# Patient Record
Sex: Female | Born: 1975 | Race: Black or African American
Health system: Midwestern US, Community
[De-identification: ages and names within clinical notes are randomized; demographics above are authoritative.]

## PROBLEM LIST (undated history)

## (undated) DIAGNOSIS — D259 Leiomyoma of uterus, unspecified: Secondary | ICD-10-CM

## (undated) HISTORY — PX: DILATION AND CURETTAGE OF UTERUS: SHX78

---

## 2001-08-07 NOTE — ED Provider Notes (Signed)
[facility]                      EMERGENCY DEPARTMENT TREATMENT REPORT   NAME:  Susan, Mack   MR #:         BILLING #: [id number]          DOS: 08/07/2001   TIME: 7:11 P   [id number]   cc:    Smitty Pluck, M.D.   Primary Physician:    Smitty Pluck, M.D.   CHIEF COMPLAINT:  Fever and sweats.   HISTORY OF PRESENT ILLNESS:  This 26 year old African-American female   presents at 33 complaining of intermittent painful breathing, cold   sweats, diffuse weakness, and muscle aches.  She has had this for the past   4 days, has coughed occasionally but has not brought anything up.  She   denies any shortness of breath, hemoptysis, calf symptoms, exertional chest   pain, rigors, or any other symptoms or injuries.   REVIEW OF SYSTEMS:   ENT: No sore throat, runny nose or other URI symptoms.   HEMATOLOGIC/LYMPHATIC:  No excessive bruising or lymph node swelling.   CARDIOVASCULAR:  No chest pain, chest pressure, or palpitations.   GASTROINTESTINAL:  No vomiting, diarrhea, or abdominal pain.   GENITOURINARY:  No dysuria, frequency, or urgency.  Normal menstrual period   last Sunday.   INTEGUMENTARY:  No rashes.   NEUROLOGICAL:  No headaches, sensory or motor symptoms.   All other systems negative.   PAST MEDICAL HISTORY, FAMILY HISTORY, SOCIAL HISTORY:  All noncontributory.   ALLERGIES:  Penicillin.   MEDICATIONS:  Birth control pills.   PHYSICAL EXAMINATION:   VITAL SIGNS:  Blood pressure 132/71, pulse 108, respirations 20,   temperature 101.9, O2 saturation 96% on room air.   GENERAL APPEARANCE:  The patient appears well developed and well nourished.   Appearance and behavior are age and situation appropriate.   HEENT:  Ears/Nose:  Hearing is grossly intact to voice.  Internal and   external examinations of the ears are unremarkable.   Mouth/Throat:   Surfaces of the pharynx, palate, and tongue are pink, moist, and without    lesions.    ENT:  Without stridor, sialorrhea, trismus, exudate, or   respiratory distress.   NECK:  Supple, nontender, symmetrical, no masses or JVD, trachea midline,   thyroid not enlarged, nodular, or tender.   RESPIRATORY:  Clear and equal breath sounds.  No respiratory distress,   tachypnea, or accessory muscle use.   CARDIOVASCULAR:  Heart regular, without murmurs, gallops, rubs, or thrills.   PMI not displaced.   Vascular:   Calves soft and nontender.  No peripheral edema or significant   varicosities. Carotid, femoral, and pedal pulses are satisfactory.  The   abdominal aorta is not palpably enlarged.   CHEST:  Chest symmetrical without masses or tenderness.   GI:  Abdomen soft, nontender, without complaint of pain to palpation.  No   hepatomegaly or splenomegaly.  No abdominal or inguinal masses appreciated   by inspection or palpation.  No cva tenderness.   SKIN:  Warm and dry without rashes.   PSYCHIATRIC:  Judgment appears appropriate.  Recent and remote memory   appear to be intact.  Oriented to time, place, and person.  Mood and affect   appropriate.   DIAGNOSTIC TESTING:   An x-ray was obtained because of the fever of unknown   origin, which showed a left lingular infiltrate, questionably  postobstructive, as read by myself.  Urine dips negative x 3.   COURSE IN THE EMERGENCY DEPARTMENT:  I spoke with Dr. Delane Ginger from   radiology, who will be in later to read the films. I am concerned that this   does represent a postobstructive infiltrate and have given the patient my   card and told her to call me this evening after 10 p.m.   I will also fax a   copy of this chart with the addended x-ray report to Dr. Sunny Schlein for her to   follow up on the patient.   The patient is discharged on Zithromax and Guaifenex, is given a   Proventil/Atrovent treatment to try and loosen the possible mucus plug   causing this pneumonia.   FINAL DIAGNOSIS:  Left lingular pneumonia, febrile.    DISPOSITION:  The patient is discharged home in stable condition, with   instructions to follow up with their regular doctor.  They are advised to   return immediately for any worsening or symptoms of concern.   ADDENDUM BY DR. MANOLIO:   Dr. Dellis Anes has read the patient's x-ray, agrees that there is a lingular   consolidation, does not see any signs that this is post obstructive.   However, he suggest a repeat chest film in several weeks; the patient is   referred to Dr. Sunny Schlein for same. The patient has been contacted with the   radiology reading and instructions.   Electronically Signed By:   Imogene Burn, M.D. 08/08/2001 01:36   ____________________________   Imogene Burn, M.D.   cd/jdm  D:  08/07/2001  T:  08/07/2001  7:10 P   [id number]

## 2011-12-14 NOTE — Care Plan (Signed)
Formatting of this note might be different from the original.  Problem: Breastfeeding (Adult, NICU, Newborn, Obstetrics, Pediatric)  Goal: Signs and symptoms of listed potential problems will be absent or manageable (reference (Breastfeeding (Adult, NICU, Newborn, Obstetrics, Pediatric)) CPG)  Outcome: Progressing  Lactation initial visit: Mom is a 36 year old P3, delivered vaginally on 11/9 at 0611.  Mom states she breastfeed her previous children without difficulties and states BFing going well with this baby.  Mom denies sore nipples or problems with baby latching on.  Reviewed breastfeeding basics:  supply and demand, newborn stomach size, early feeding cues,importance of skin to skin, positioning and baby led latch-on, asymetrical latch with signs of good, deep latch vs shallow, feeding frequency and duration, and log book for tracking infant feedings and output.   Guide to Successful Breastfeeding Booklet, Women's Health [employer] Resource Handout, and Breastfeeding Information Line number all given. Reviewed pt's medications and compatible birth control.  Discussed typical newborn weight loss and the importance of infant weight checks with Pediatrician 1-2 days post discharge.  Mom states she wants to rent a pump for when she is at home, to be able to give the baby some bottles if she is not home.  Mom denies going back to work right now, but states she still wants a pump.  She had called her insurance and said they require a doctor's prescription to get a breast pump.  Encouraged her to talk with her OB in the morning, and let her primary nurse know as well, so she can get the Rx before she leaves.  Discussed the difference between hospital grade rental pumps and store bought double electric/hand pumps discussed.  [employer] Resource handout given and list of area pump rental locations and lactation support services reviewed.  Mom states she is leaning more toward renting. Encouraged mom to let us know soon  if she wants to rent one from here.  Will recheck on Monday before discharge.  Mom denies any questions at this time.  Encouraged her to call [facility] or primary RN for assistance, questions or concerns.      Electronically signed by Asencion NobleWest, Lori E, RN at 12/14/2011  3:29 PM EST

## 2011-12-14 NOTE — H&P (Signed)
Formatting of this note is different from the original.  Images from the original note were not included.      [facility]  History and Physical    History of Present Illness:     Susan Mack is a 36 y.o. Female [id number] at 8427w5d with an estimated Date of Delivery: 11/29/09. EDC is based on early ultrasound.    Pt presents to L&D complaining of increasing contractions since 2000. She noted mild vaginal bleeding and fluid loss occuring about 0515 this AM. No s/sx of PIH, +FM    Pt is followed for her prenatal care by [facility] ([facility])    Other complications with this pregnancy include: AMA, A2DM (Metformin)    Subjective Data:     OBHx: A5W0981: G5P0131  Year     Delivery Type     GA     Sex    Weight        Complications  2005         SAB           6 Weeks  2005            SVD            1052w0d    M         3-7             Baby with CP  2007            SAB           6 Weeks    2010            MAB           6 Weeks                               D&C  2011            SVD            1827w5d    M         7-15    2013          Current    GynHx:  Menarche: 16  Menses: every 28 days, bleeding for 5 days  STDs: Denied  Abnormal paps: 2003 - now normal    PMHx:  Multiple miscarriages.     PSHx:  D&C 2010    SocHx:  No tobacco, alcohol, or illicits.     FamHx:  Family History   Problem Relation Age of Onset   ? Diabetes Mother    ? Cancer Maternal Grandmother      BREAST   ? Cancer Paternal Grandmother    ? Cancer Paternal Grandfather      LUNG   ? Hypertension Father    ? Cancer Maternal Grandfather      LUNG     Meds:  Metformin 500 BID.     Allergies:  Allergies   Allergen Reactions   ? Penicillins unknown     POSSIBLY RASH     Objective Data/Physical Exam:     Pulse 86  SpO2 100%  Breastfeeding? Yes    PHYSICAL EXAM:     Constitutional: She is a gravid alert, cooperative, healthy female   Cardiovascular: Regular rate, rhythm   Pulmonary: breath sounds normal and effort normal   Abdominal: gravid, appearance  normal, soft and non-tender   Extremities: no clubbing/cyanosis/edema  Pelvic/Vaginal  Exam     Cervix: Vaginal exam by: MD  Dilation: 5 cm  Effacement: 80%  Station: -3     Bedside Ultrasound            Single Deepest Pocket: 2.1 cm    Fetal Presentation: Vertex     Fetal Assessment:   Baseline: 150  Variability: Moderate (6-25 bpm)  Accelerations: Present  Decelerations:Absent       Contractions:  Toco:occurring every  3 minute(s)    Lab Review  Blood Type: O+  RPR: NR  Rubella: Immune  HBSAG: Negative  HIV: NR  GC: Negative  CHLAMYDIA: Negative  Group B Strep: Negative    No results found for this visit on 12/14/11 (from the past 24 hour(s)).    Assessment/Plan:     36 y.o. F Z5G3875 with IUP at [redacted]w[redacted]d, active labor. Complications include A2DM on metformin, AMA.     1. Pt will be staying for observation on L&D.  2. Anticipate vaginal delivery.   3. O+  4. Rubella Immune  5. GBS -  6. HIV/HBS/GC/Chlamydia negative.   7. Epidural and nubain planned for pain control.   8. q2h accuchecks     Jacky Kindle, M.D.  Emergency Medicine, PGY-1  12/14/2011, 5:09 AM  [phone]    OB ATTENDING  I have personally evaluated this patient with the resident and I agree with the assessment and plan as outlined above.  Arie Sabina, MD   Electronically signed by Elson Clan at 12/14/2011  6:46 AM EST

## 2011-12-14 NOTE — Procedures (Signed)
Formatting of this note might be different from the original.  Images from the original note were not included.                   [facility] Department of Obstetrics and Gynecology  Vaginal Delivery Note             Pre-operative Diagnosis: Intrauterine pregnancy at:4655w5d   Post-operative Diagnosis: same  Procedure: SVD  Surgeon: Magda KielN Gillman, MD    Assist:  Mertha FindersIbrahim A Hammad, MD,RES, MD    Anesthesia: Local      IInfant delivered in vertex presentation, OA position.No nuchal cord. Anterior shoulder delivered followed by the posterior shoulder without complication.  Cord was clamped and cut, infant handed to awaiting respiratory technician. Sample for cord gases taken. Placenta was delivered with gentle cord traction and massage, intact, and a 3 vessel cord was noted. A first degree perineal lacerations was noted and repaired with 2-0 vicryl.The lower uterine segment was cleared of clots and tone improved with massage. Uterus was firm and good hemostasis was observed upon completion of the procedure. Instrument, sponge, and needle counts were correct.     Infant A Delivery Info  Delivery Date/Time: Infant Delivery -Date Baby A: 12/14/11 @  Infant Delivery -Time Baby A: [phone]  Sex: Gender Baby A: Female  Apgars:  8/ 9 /    Findings:  Placenta complete, spontaneous, 3 VC    Estimated Blood Loss:  300    Specimens: none    Complications:  None; patient tolerated the procedure well.    Disposition: PACU - hemodynamically stable.    Condition: stable    The attending was present throughout the entire procedure    Author:   Alphia MohIbrahim Hammad, MD-PGY 2  Obstetrics & Gynecology  Pager # [phone]  12/14/2011, 6:34 AM    OB Attending Attestation: I was present and participated in  the entire procedure    Arie SabinaNicole C. Gillman, MD, FACOG    Electronically signed by Elson ClanGillman, Nicole at 12/14/2011  6:46 AM EST

## 2011-12-15 NOTE — Progress Notes (Signed)
Formatting of this note is different from the original.  Postpartum Progress Note - s/p Vaginal Delivery    S - no complaints, pain controlled, lochia decreasing, tolerating PO, urinating, ambulating    O -   BP 119/58  Pulse 75  Temp(Src) 97.5 F (36.4 C)  Resp 18  Ht 5\' 10"  (1.778 m)  Wt 136.079 kg (300 lb)  BMI 43.05 kg/m2  SpO2 99%  Breastfeeding? Yes  Systolic (24hrs), Avg:133 mmHg, Min:108 mmHg, Max:157 mmHg  Diastolic (24hrs), Avg:66 mmHg, Min:56 mmHg, Max:80 mmHg    General -  NAD, AO x 3  Heart -  RRR, no murmurs  Lung - CTAB  Abd -  S, NT, ND, Fundus firm   Ext -  No c/c/e    Lab Results   Component Value Date    HEMOGLOBIN 10.6* 12/15/2011    HEMOGLOBIN 11.8 12/14/2011    HCT 32.5* 12/15/2011    HCT 36.3 12/14/2011     A/P -   36 y.o. F Z3Y8657G6P0232 PPD#1 s/p SVD at 6868w5d, A2DM      1. Continue routine care  2. A2DM: pt previously on metformin, FBS today:89, will not restart home meds.   3. Blood Type: O positive, Rhogam not indicated  4. Rubella immune  5. Breast Feeding  6. Contraception - minipill  7. Discharge planned for PPD#2, 12/16/11    Laren BoomKatherine Weyer, MD, PGY1  Obstetrics and Gynecology  [facility]  Pager Number: [phone]  12/15/2011, 8:05 AM    OB Attending  I saw and evaluated patient personally.  I agree with resident assessment and plan.    Britt BoozerStephen S. Earlene Plateravis, M.D.  12/15/2011, 1:42 PM  Electronically signed by Kerin Ransomavis, Stephen S, MD at 12/15/2011  1:42 PM EST

## 2011-12-16 NOTE — Progress Notes (Signed)
Formatting of this note might be different from the original.  Lactation note:  I called DME and spoke to liaision Arlys JohnBrian to see if [employer] [place]. DME liaision to call & speak to patient. Dad had already called [employer] & asked about coverage.   Electronically signed by Carloyn MannerLee, Freya P, RN at 12/16/2011  4:53 PM EST

## 2011-12-16 NOTE — Progress Notes (Signed)
Formatting of this note is different from the original.  Postpartum Progress Note - s/p Vaginal Delivery    S - no complaints, no pain, lochia decreasing, tolerating PO, urinating, ambulating    O -   BP 131/78  Pulse 94  Temp(Src) 98.1 F (36.7 C)  Resp 20  Ht 5\' 10"  (1.778 m)  Wt 136.079 kg (300 lb)  BMI 43.05 kg/m2  SpO2 97%  Breastfeeding? Yes  Systolic (24hrs), Avg:130 mmHg, Min:128 mmHg, Max:132 mmHg  Diastolic (24hrs), Avg:69 mmHg, Min:59 mmHg, Max:78 mmHg    General -  NAD, AO x 3  Heart -  RRR, no murmurs  Lung - CTAB  Abd -  S, NT, ND, Fundus firm   Ext -  No c/c/e    Lab Results   Component Value Date    HEMOGLOBIN 10.6* 12/15/2011    HEMOGLOBIN 11.8 12/14/2011    HCT 32.5* 12/15/2011    HCT 36.3 12/14/2011     A/P -   36 y.o. F [id number] PPD2 s/p SVD at 7761w5d, A2DM.     1. Continue routine care  2. A2DM: pt previously on metformin, FBS yesterday:89, will not restart home meds.   3. Blood Type: O positive, Rhogam not indicated  4. Rubella immune  5. Breast Feeding  6. Contraception - minipill - counseled about importance of taking pill at same time everyday for effectiveness.   7. Discharge planned for today. Patient will f/u with [facility] (Dr. Jolyn Napattel in six weeks).     Jacky KindleZane W. Shuck, M.D.  Emergency Medicine, PGY-1  12/16/2011, 6:47 AM  [phone]    MFM Staff Note  Patient seen and evaluated.  Agree with assessment and plan.  Barbera SettersJames B. Loleta ChanceHill, MD  Electronically signed by Roxanna MewHill, James B at 12/16/2011  8:09 AM EST

## 2011-12-16 NOTE — Care Plan (Signed)
Formatting of this note might be different from the original.  Problem: Breastfeeding (Adult, NICU, Newborn, Obstetrics, Pediatric)  Goal: Signs and symptoms of listed potential problems will be absent or manageable (reference (Breastfeeding (Adult, NICU, Newborn, Obstetrics, Pediatric)) CPG)  Outcome: Progressing  Recheck Visit:  Mother is breastfeeding upon entry into room.  Baby observed with a shallow latch.  Discussed importance of a good, deep , asymmetrical latch to prevent nipple pain trauma and to provide a good transfer of colostrum/milk.   Demo'd breaking suction by inserting finger in infants mouth and assisted with a deeper latch.  Encouraged working with infant to stimulate a wide open mouth gape and bringing baby to breast, not breast to baby.  Emphasized importance of skin to skin, laid back positioning and baby led latching on.          Mother expresses interest in obtaining a pump and providing bottled breastmilk for baby.  Mother encouraged to contact insurance.  Physicians approached for prescription for breastpump.  Electronically signed by Benna DunksStarkey, Jacqueline D, RN at 12/16/2011 12:23 PM EST

## 2011-12-16 NOTE — Progress Notes (Signed)
Formatting of this note might be different from the original.  [facility]:      Thank you for this referral.  Carollee Siresnterviewed patient's spouse today and verified discharge address/telephone.  Following patient for home medical/respiratory needs (Breast Pump) upon discharge.  Will continue to follow and provide item(s) once patient/spouse confirms from her insurance company that [employer] [place] may provide item and patient agrees to accept item.  Spouse provided our contact telephone when decision if finalized.  Thanks again.    Genene ChurnJames M. [facility]  [facility] Liaison  [phone]  Electronically signed by Koleen Distanceculto, James M at 12/16/2011 12:20 PM EST

## 2011-12-16 NOTE — Discharge Summary (Signed)
Formatting of this note is different from the original.      [facility] Discharge Summary    Admission Date: 12/14/2011  Admission Diagnosis: Active labor    Discharge Date:  12/16/2011  Discharge Diagnosis: Spontaneous vaginal delivery at 1833w5d    Antepartum complications:   Patient Active Problem List    Diagnosis Date Noted   ? Supervision of normal pregnancy 12/14/2011   ? Pregnancy, subsequent 11/20/2009     Patient presented to L&D with increasing frequency of contractions with mild vaginal bleeding and fluid loss.   Followed by Dr. Jolyn Napattel  Pregnancy complicated by :A2DM (Metformin)    Intrapartum Course: Uncomplicated. First degree perineal laceration noted and repaired with 2-0 vicryl.     Postpartum Course:  Uncomplicated. FBS 89 PPD1, metformin stopped. No other issues noted.  All postpartum goals were achieved including pain control, PO toleration, voiding, lochia decreasing, and ambulating. Thus, she was discharged on PPD2 in stable condition.    Delivered By: Delivering Physician In: Caryl BisGilliman,MD     Infant A Delivery Info  Newborn Outcome Baby A: Liveborn  Date/Time of Delivery: Infant Delivery -Date Baby A: 12/14/11 @ Infant Delivery -Time Baby A: [phone]  Delivery Type: Type of Delivery Baby A: Vaginal  Sex: Gender Baby A: Female  Apgars:  Apgar Score 1 min Baby A: 8   Apgar Score 5 min Baby A: 9       Weight: Weight: Birth (grams) Baby A: 3745 Grams  Pounds: 8 lbs Ounce(s): 4   Information for the patient's newborn:  Effie BerkshireFutrell, Boy Emmanuel [[id number]][[id number]]   Weight: 3.745 kg (8 lb 4.1 oz)    Anesthesia: None    Intrapartum Complications: None    Laceration:  1st degree perineal laceration repaired with 2-0 vicryl    Placenta: 3VC, complete, delivered spontaneously.     Rubella vaccine:  immune  Immunization History   Administered Date(s) Administered   ? Influenza Vaccine Whole 11/05/2011   ? PNEUMO 0.965mL(PNEUMOVAX 23)VAC Inj 12/15/2011   ? TDAP 2.5-8-5 Lf-mcg-lf/0.5 Ml(BOOSTRIX-syr)  11/22/2009     No results found for this basename: RUBELLA,  in the last 72 hours    Postpartum Info:   36 y.o. P2R5188G5P2133 who was discharged on 12/16/2011 s/p SVD@[redacted]w[redacted]d . .      Lab Results   Component Value Date    HEMOGLOBIN 10.6* 12/15/2011    HCT 32.5* 12/15/2011     ABORH TYPE   Date Value Range Status   05/02/2009 O Pos   Final       Plan:     Current Discharge Medication List     START taking these medications    Details   Norethindrone, Contraceptive, (NOR-QD) 0.35 mg PO TABS Take 1 Tab by Mouth Once a Day.  Qty: 30 Tab, Refills: 11     calcium carbonate/vitamin D (OSCAL-D) 600 mg(1,500mg ) -200 unit PO TABS Take 1 Tab by Mouth Twice Daily.  Qty: 60 Tab, Refills: 5     ibuprofen (MOTRIN) 600 mg PO TABS Take 1 Tab by Mouth Every 6 Hours As Needed (mild pain).  Qty: 30 Tab, Refills: 0     Docusate Sodium (COLACE) 100 mg PO TABS Take 1 Tab by Mouth Once a Day.  Qty: 30 Tab, Refills: 0       STOP taking these medications      iron-fa-b cmplx-vit c-dss (NEPHRON FA) PO TABS Comments:   Reason for Stopping:       metFORMIN (GLUCOPHAGE) 500  mg PO TABS Comments:   Reason for Stopping:       PRENATAL VIT/FE FUMARATE/FA (PRENATAL PLUS) 27-1 mg PO TABS Comments:   Reason for Stopping:          Contraceptives: oral birth control pills (minipill)  Preprinted discharge instructions given to patient.    Discharge Procedure Orders  DISCHARGE: Regular Diet   DISCHARGE: Regular Diet     DISCHARGE ACTIVITY   Pelvic rest for 6 weeks.   No driving for 2 weeks or while on narcotics.  No heavy lifting or strenuous exercise for 6 weeks.     Discharge: Notify MD for signs and symptoms-   Discharge: Notify MD for signs and symptoms-  Severe pain, heavy bleeding, temperature GREATER than 100.36F or any other concerns     DISCHARGE: Follow Up with Obstetrician for post partum visit   Follow up with Dr. Jolyn Nap in 6 week(s)  [phone]       Jacky Kindle, M.D.  Emergency Medicine, PGY-1  12/16/2011, 7:15 AM  [phone]      Electronically  signed by Roxanna Mew B at 12/16/2011  8:10 AM EST

## 2014-02-20 ENCOUNTER — Emergency Department: Payer: Self-pay | Admitting: Physician Assistant

## 2014-02-20 LAB — CBC
HCT: 35.8 % (ref 35.0–47.0)
HGB: 11.5 g/dL — ABNORMAL LOW (ref 12.0–16.0)
MCH: 27.8 pg (ref 26.0–34.0)
MCHC: 32.1 g/dL (ref 32.0–36.0)
MCV: 87 fL (ref 80–100)
PLATELETS: 296 10*3/uL (ref 150–440)
RBC: 4.13 10*6/uL (ref 3.80–5.20)
RDW: 12.9 % (ref 11.5–14.5)
WBC: 8.3 10*3/uL (ref 3.6–11.0)

## 2014-02-20 LAB — BASIC METABOLIC PANEL
ANION GAP: 5 — AB (ref 7–16)
BUN: 10 mg/dL (ref 7–18)
Calcium, Total: 8.4 mg/dL — ABNORMAL LOW (ref 8.5–10.1)
Chloride: 104 mmol/L (ref 98–107)
Co2: 30 mmol/L (ref 21–32)
Creatinine: 0.9 mg/dL (ref 0.60–1.30)
Glucose: 129 mg/dL — ABNORMAL HIGH (ref 65–99)
OSMOLALITY: 278 (ref 275–301)
Potassium: 3.9 mmol/L (ref 3.5–5.1)
Sodium: 139 mmol/L (ref 136–145)

## 2015-07-21 ENCOUNTER — Other Ambulatory Visit: Payer: Self-pay | Admitting: Adult Health

## 2015-07-21 DIAGNOSIS — M7989 Other specified soft tissue disorders: Secondary | ICD-10-CM

## 2015-07-26 ENCOUNTER — Ambulatory Visit: Payer: Managed Care, Other (non HMO)

## 2015-08-01 ENCOUNTER — Other Ambulatory Visit: Payer: Self-pay | Admitting: Adult Health

## 2015-08-01 DIAGNOSIS — M7989 Other specified soft tissue disorders: Secondary | ICD-10-CM

## 2015-08-03 ENCOUNTER — Ambulatory Visit
Admission: RE | Admit: 2015-08-03 | Discharge: 2015-08-03 | Disposition: A | Discharge status: Home / Self Care | Payer: Managed Care, Other (non HMO) | Source: Ambulatory Visit | Attending: Adult Health | Admitting: Adult Health

## 2015-08-03 DIAGNOSIS — M7989 Other specified soft tissue disorders: Secondary | ICD-10-CM | POA: Diagnosis present

## 2016-03-11 ENCOUNTER — Ambulatory Visit (INDEPENDENT_AMBULATORY_CARE_PROVIDER_SITE_OTHER): Payer: Commercial Managed Care - PPO | Admitting: Vascular Surgery

## 2016-03-11 ENCOUNTER — Encounter (INDEPENDENT_AMBULATORY_CARE_PROVIDER_SITE_OTHER): Payer: Self-pay | Admitting: Vascular Surgery

## 2016-03-11 DIAGNOSIS — M79605 Pain in left leg: Secondary | ICD-10-CM | POA: Diagnosis not present

## 2016-03-11 DIAGNOSIS — I890 Lymphedema, not elsewhere classified: Secondary | ICD-10-CM

## 2016-03-11 DIAGNOSIS — I872 Venous insufficiency (chronic) (peripheral): Secondary | ICD-10-CM | POA: Diagnosis not present

## 2016-03-11 DIAGNOSIS — M79604 Pain in right leg: Secondary | ICD-10-CM | POA: Diagnosis not present

## 2016-03-11 DIAGNOSIS — M79609 Pain in unspecified limb: Secondary | ICD-10-CM | POA: Insufficient documentation

## 2016-03-11 NOTE — Progress Notes (Signed)
MRN : [id number]  Suzanne Jenkins is a 41 y.o. ([birth year]) female who presents with chief complaint of  Chief Complaint  Patient presents with  . Follow-up  .  History of Present Illness: The patient returns to the office for followup evaluation regarding leg swelling.  The swelling has persisted and the pain associated with swelling continues. There have not been any interval development of a ulcerations or wounds.  Since the previous visit the patient has been wearing graduated compression stockings and has noted little if any improvement in the lymphedema. The patient has been using compression routinely morning until night.  The patient also states elevation during the day and exercise is being done too.   No outpatient prescriptions have been marked as taking for the 03/11/16 encounter (Office Visit) with Renford Dills, MD.    History reviewed. No pertinent past medical history.  Past Surgical History:  Procedure Laterality Date  . DILATION AND CURETTAGE OF UTERUS      Social History Social History  Substance Use Topics  . Smoking status: Never Smoker  . Smokeless tobacco: Never Used  . Alcohol use Yes     Comment: ocassionally    Family History Family History  Problem Relation Age of Onset  . Diabetes Mother   . Hypertension Father   . Cancer Paternal Grandfather   No family history of bleeding/clotting disorders, porphyria or autoimmune disease   Allergies  Allergen Reactions  . Penicillins Other (See Comments)     REVIEW OF SYSTEMS (Negative unless checked)  Constitutional: [] Weight loss  [] Fever  [] Chills Cardiac: [] Chest pain   [] Chest pressure   [] Palpitations   [] Shortness of breath when laying flat   [] Shortness of breath with exertion. Vascular:  [] Pain in legs with walking   [] Pain in legs at rest  [] History of DVT   [] Phlebitis   [x] Swelling in legs   [x] Varicose veins   [] Non-healing ulcers Pulmonary:   [] Uses home oxygen   [] Productive  cough   [] Hemoptysis   [] Wheeze  [] COPD   [] Asthma Neurologic:  [] Dizziness   [] Seizures   [] History of stroke   [] History of TIA  [] Aphasia   [] Vissual changes   [] Weakness or numbness in arm   [] Weakness or numbness in leg Musculoskeletal:   [] Joint swelling   [] Joint pain   [] Low back pain Hematologic:  [] Easy bruising  [] Easy bleeding   [] Hypercoagulable state   [] Anemic Gastrointestinal:  [] Diarrhea   [] Vomiting  [] Gastroesophageal reflux/heartburn   [] Difficulty swallowing. Genitourinary:  [] Chronic kidney disease   [] Difficult urination  [] Frequent urination   [] Blood in urine Skin:  [x] Venous Rashes   [] Ulcers  Psychological:  [] History of anxiety   []  History of major depression.  Physical Examination  Vitals:   03/11/16 0928  BP: (!) 149/91  Pulse: 86  Resp: 18  Weight: (!) 362 lb (164.2 kg)  Height: 5\' 10"  (1.778 m)   Body mass index is 51.94 kg/m. Gen: WD/WN, NAD Head: [place]/AT, No temporalis wasting.  Ear/Nose/Throat: Hearing grossly intact, nares w/o erythema or drainage, poor dentition Eyes: PER, EOMI, sclera nonicteric.  Neck: Supple, no masses.  No bruit or JVD.  Pulmonary:  Good air movement, clear to auscultation bilaterally, no use of accessory muscles.  Cardiac: RRR, normal S1, S2, no Murmurs. Vascular:   4+ pitting edema of the ankles and legs bilaterally venous stasis dermatitis noted bilaterally no ulcers Vessel Right Left  Radial Palpable Palpable  Ulnar Palpable Palpable  Brachial Palpable Palpable  Carotid Palpable Palpable  Femoral Palpable Palpable  Popliteal Palpable Palpable  PT Trace Palpable Trace Palpable  DP Trace Palpable Trace Palpable  Gastrointestinal: soft, non-distended. No guarding/no peritoneal signs.  Musculoskeletal: M/S 5/5 throughout.  No deformity or atrophy.  Neurologic: CN 2-12 intact. Pain and light touch intact in extremities.  Symmetrical.  Speech is fluent. Motor exam as listed above. Psychiatric: Judgment intact, Mood &  affect appropriate for pt's clinical situation. Dermatologic: No rashes or ulcers noted.  No changes consistent with cellulitis. Lymph : No Cervical lymphadenopathy, no lichenification or skin changes of chronic lymphedema.  CBC Lab Results  Component Value Date   WBC 8.3 02/20/2014   HGB 11.5 (L) 02/20/2014   HCT 35.8 02/20/2014   MCV 87 02/20/2014   PLT 296 02/20/2014    BMET    Component Value Date/Time   NA 139 02/20/2014 1514   K 3.9 02/20/2014 1514   CL 104 02/20/2014 1514   CO2 30 02/20/2014 1514   GLUCOSE 129 (H) 02/20/2014 1514   BUN 10 02/20/2014 1514   CREATININE 0.90 02/20/2014 1514   CALCIUM 8.4 (L) 02/20/2014 1514   GFRNONAA >60 02/20/2014 1514   GFRAA >60 02/20/2014 1514   CrCl cannot be calculated (Patient's most recent lab result is older than the maximum 21 days allowed.).  COAG No results found for: INR, PROTIME  Radiology No results found.  Assessment/Plan 1. Lymph edema Recommend:  No surgery or intervention at this point in time.    I have reviewed my previous discussion with the patient regarding swelling and why it causes symptoms.  Patient will continue wearing graduated compression stockings class 1 (20-30 mmHg) on a daily basis. The patient will  beginning wearing the stockings first thing in the morning and removing them in the evening. The patient is instructed specifically not to sleep in the stockings.    In addition, behavioral modification including several periods of elevation of the lower extremities during the day will be continued.  This was reviewed with the patient during the initial visit.  The patient will also continue routine exercise, especially walking on a daily basis as was discussed during the initial visit.    Despite conservative treatments including graduated compression therapy class 1 and behavioral modification including exercise and elevation the patient  has not obtained adequate control of the lymphedema.  The  patient still has stage 3 lymphedema and therefore, I believe that a lymph pump should be added to improve the control of the patient's lymphedema.  Additionally, a lymph pump is warranted because it will reduce the risk of cellulitis and ulceration in the future.  Patient should follow-up in six months    2. Chronic venous insufficiency See #1  3. Pain in both lower extremities See #1    Levora DredgeGregory Schnier, MD  03/11/2016 10:05 AM

## 2016-07-12 ENCOUNTER — Ambulatory Visit: Payer: Commercial Managed Care - PPO | Attending: Internal Medicine

## 2016-07-12 DIAGNOSIS — G4733 Obstructive sleep apnea (adult) (pediatric): Secondary | ICD-10-CM | POA: Diagnosis present

## 2016-09-09 ENCOUNTER — Ambulatory Visit (INDEPENDENT_AMBULATORY_CARE_PROVIDER_SITE_OTHER): Payer: Commercial Managed Care - PPO | Admitting: Vascular Surgery

## 2017-01-10 ENCOUNTER — Other Ambulatory Visit: Payer: Self-pay | Admitting: Family Medicine

## 2017-01-10 DIAGNOSIS — R109 Unspecified abdominal pain: Secondary | ICD-10-CM

## 2017-01-16 ENCOUNTER — Ambulatory Visit
Admission: RE | Admit: 2017-01-16 | Discharge: 2017-01-16 | Disposition: A | Discharge status: Home / Self Care | Payer: Commercial Managed Care - PPO | Source: Ambulatory Visit | Attending: Family Medicine | Admitting: Family Medicine

## 2017-01-16 DIAGNOSIS — K760 Fatty (change of) liver, not elsewhere classified: Secondary | ICD-10-CM | POA: Diagnosis not present

## 2017-01-16 DIAGNOSIS — R109 Unspecified abdominal pain: Secondary | ICD-10-CM | POA: Diagnosis not present

## 2017-11-11 ENCOUNTER — Telehealth (INDEPENDENT_AMBULATORY_CARE_PROVIDER_SITE_OTHER): Payer: Self-pay

## 2017-11-11 NOTE — Telephone Encounter (Signed)
Patient was last seen in Feb/2018 and called asking about the lymphedema pump which had been order back in 2018.The patient stated that she had not heard from the company but I inform her that she needed to be seen in the office because her last office visit was in 2018.The patient will be making a upcoming appointment

## 2018-01-21 ENCOUNTER — Other Ambulatory Visit: Payer: Self-pay | Admitting: Student

## 2018-01-21 DIAGNOSIS — Z1231 Encounter for screening mammogram for malignant neoplasm of breast: Secondary | ICD-10-CM

## 2018-01-22 ENCOUNTER — Encounter (INDEPENDENT_AMBULATORY_CARE_PROVIDER_SITE_OTHER): Payer: Self-pay | Admitting: Vascular Surgery

## 2018-01-22 ENCOUNTER — Ambulatory Visit (INDEPENDENT_AMBULATORY_CARE_PROVIDER_SITE_OTHER): Payer: Commercial Managed Care - PPO | Admitting: Vascular Surgery

## 2018-01-22 VITALS — BP 154/92 | HR 104 | Resp 16 | Ht 70.5 in | Wt 353.0 lb

## 2018-01-22 DIAGNOSIS — M79604 Pain in right leg: Secondary | ICD-10-CM | POA: Diagnosis not present

## 2018-01-22 DIAGNOSIS — I872 Venous insufficiency (chronic) (peripheral): Secondary | ICD-10-CM | POA: Diagnosis not present

## 2018-01-22 DIAGNOSIS — M79605 Pain in left leg: Secondary | ICD-10-CM | POA: Diagnosis not present

## 2018-01-22 DIAGNOSIS — I890 Lymphedema, not elsewhere classified: Secondary | ICD-10-CM

## 2018-01-22 NOTE — Progress Notes (Signed)
MRN : [id number]  Suzanne Jenkins is a 42 y.o. ([birth year]) female who presents with chief complaint of  Chief Complaint  Patient presents with  . Follow-up  .  History of Present Illness:  The patient returns to the office for followup evaluation regarding leg swelling.  The swelling has persisted and the pain associated with swelling continues. There have not been any interval development of a ulcerations or wounds.  Since the previous visit the patient has been wearing graduated compression stockings and has noted little if any improvement in the lymphedema. The patient has been using compression routinely morning until night.  The patient also states elevation during the day and exercise is being done too.   Current Meds  Medication Sig  . amphetamine-dextroamphetamine (ADDERALL XR) 25 MG 24 hr capsule Take 1 capsule by mouth daily.  . Cholecalciferol (VITAMIN D3) 50 MCG (2000 UT) capsule Take 1 capsule by mouth daily.  . Multiple Vitamins-Minerals (MULTIVITAMIN ADULT PO) Take 1 tablet by mouth daily.  . Vitamin D, Ergocalciferol, (DRISDOL) 1.25 MG (50000 UT) CAPS capsule Take 1 capsule by mouth once a week.    History reviewed. No pertinent past medical history.  Past Surgical History:  Procedure Laterality Date  . DILATION AND CURETTAGE OF UTERUS      Social History Social History   Tobacco Use  . Smoking status: Never Smoker  . Smokeless tobacco: Never Used  Substance Use Topics  . Alcohol use: Yes    Comment: ocassionally  . Drug use: No    Family History Family History  Problem Relation Age of Onset  . Diabetes Mother   . Hypertension Father   . Cancer Paternal Grandfather     Allergies  Allergen Reactions  . Penicillins Other (See Comments)     REVIEW OF SYSTEMS (Negative unless checked)  Constitutional: [] Weight loss  [] Fever  [] Chills Cardiac: [] Chest pain   [] Chest pressure   [] Palpitations   [] Shortness of breath when laying flat    [] Shortness of breath with exertion. Vascular:  [] Pain in legs with walking   [x] Pain in legs with dependency  [] History of DVT   [] Phlebitis   [x] Swelling in legs   [] Varicose veins   [] Non-healing ulcers Pulmonary:   [] Uses home oxygen   [] Productive cough   [] Hemoptysis   [] Wheeze  [] COPD   [] Asthma Neurologic:  [] Dizziness   [] Seizures   [] History of stroke   [] History of TIA  [] Aphasia   [] Vissual changes   [] Weakness or numbness in arm   [] Weakness or numbness in leg Musculoskeletal:   [] Joint swelling   [x] Joint pain   [x] Low back pain Hematologic:  [] Easy bruising  [] Easy bleeding   [] Hypercoagulable state   [] Anemic Gastrointestinal:  [] Diarrhea   [] Vomiting  [] Gastroesophageal reflux/heartburn   [] Difficulty swallowing. Genitourinary:  [] Chronic kidney disease   [] Difficult urination  [] Frequent urination   [] Blood in urine Skin:  [] Rashes   [] Ulcers  Psychological:  [] History of anxiety   []  History of major depression.  Physical Examination  Vitals:   01/22/18 1604  BP: (!) 154/92  Pulse: (!) 104  Resp: 16  Weight: (!) 353 lb (160.1 kg)  Height: 5' 10.5" (1.791 m)   Body mass index is 49.93 kg/m. Gen: WD/WN, NAD Head: [place]/AT, No temporalis wasting.  Ear/Nose/Throat: Hearing grossly intact, nares w/o erythema or drainage Eyes: PER, EOMI, sclera nonicteric.  Neck: Supple, no large masses.   Pulmonary:  Good air movement, no audible wheezing bilaterally, no  use of accessory muscles.  Cardiac: RRR, no JVD Vascular: scattered varicosities present bilaterally.  Mild venous stasis changes to the legs bilaterally.  3-4+ soft pitting edema left > right Vessel Right Left  Radial Palpable Palpable  PT Palpable Palpable  DP Palpable Palpable  Gastrointestinal: Non-distended. No guarding/no peritoneal signs.  Musculoskeletal: M/S 5/5 throughout.  No deformity or atrophy.  Neurologic: CN 2-12 intact. Symmetrical.  Speech is fluent. Motor exam as listed above. Psychiatric: Judgment  intact, Mood & affect appropriate for pt's clinical situation. Dermatologic: venous rashes no ulcers noted.  No changes consistent with cellulitis. Lymph : No lichenification or skin changes of chronic lymphedema.  CBC Lab Results  Component Value Date   WBC 8.3 02/20/2014   HGB 11.5 (L) 02/20/2014   HCT 35.8 02/20/2014   MCV 87 02/20/2014   PLT 296 02/20/2014    BMET    Component Value Date/Time   NA 139 02/20/2014 1514   K 3.9 02/20/2014 1514   CL 104 02/20/2014 1514   CO2 30 02/20/2014 1514   GLUCOSE 129 (H) 02/20/2014 1514   BUN 10 02/20/2014 1514   CREATININE 0.90 02/20/2014 1514   CALCIUM 8.4 (L) 02/20/2014 1514   GFRNONAA >60 02/20/2014 1514   GFRAA >60 02/20/2014 1514   CrCl cannot be calculated (Patient's most recent lab result is older than the maximum 21 days allowed.).  COAG No results found for: INR, PROTIME  Radiology No results found.    Assessment/Plan 1. Lymphedema Recommend:  No surgery or intervention at this point in time.    I have reviewed my previous discussion with the patient regarding swelling and why it causes symptoms.  Patient will continue wearing graduated compression stockings class 1 (20-30 mmHg) on a daily basis. The patient will  beginning wearing the stockings first thing in the morning and removing them in the evening. The patient is instructed specifically not to sleep in the stockings.    In addition, behavioral modification including several periods of elevation of the lower extremities during the day will be continued.  This was reviewed with the patient during the initial visit.  The patient will also continue routine exercise, especially walking on a daily basis as was discussed during the initial visit.    Despite conservative treatments including graduated compression therapy class 1 and behavioral modification including exercise and elevation the patient  has not obtained adequate control of the lymphedema.  The patient  still has stage 3 lymphedema and therefore, I believe that a lymph pump should be added to improve the control of the patient's lymphedema.  Additionally, a lymph pump is warranted because it will reduce the risk of cellulitis and ulceration in the future.  Patient should follow-up in six months    2. Chronic venous insufficiency Recommend:  No surgery or intervention at this point in time.    I have reviewed my previous discussion with the patient regarding swelling and why it causes symptoms.  Patient will continue wearing graduated compression stockings class 1 (20-30 mmHg) on a daily basis. The patient will  beginning wearing the stockings first thing in the morning and removing them in the evening. The patient is instructed specifically not to sleep in the stockings.    In addition, behavioral modification including several periods of elevation of the lower extremities during the day will be continued.  This was reviewed with the patient during the initial visit.  The patient will also continue routine exercise, especially walking on a daily  basis as was discussed during the initial visit.    Despite conservative treatments including graduated compression therapy class 1 and behavioral modification including exercise and elevation the patient  has not obtained adequate control of the lymphedema.  The patient still has stage 3 lymphedema and therefore, I believe that a lymph pump should be added to improve the control of the patient's lymphedema.  Additionally, a lymph pump is warranted because it will reduce the risk of cellulitis and ulceration in the future.  Patient should follow-up in six months    3. Pain in both lower extremities See #1&2    Levora Dredge, MD  01/22/2018 4:47 PM

## 2018-02-12 DIAGNOSIS — E538 Deficiency of other specified B group vitamins: Secondary | ICD-10-CM | POA: Diagnosis not present

## 2018-02-12 DIAGNOSIS — E119 Type 2 diabetes mellitus without complications: Secondary | ICD-10-CM | POA: Diagnosis not present

## 2018-02-12 DIAGNOSIS — E559 Vitamin D deficiency, unspecified: Secondary | ICD-10-CM | POA: Diagnosis not present

## 2018-02-17 ENCOUNTER — Ambulatory Visit
Admission: RE | Admit: 2018-02-17 | Discharge: 2018-02-17 | Disposition: A | Discharge status: Home / Self Care | Payer: Commercial Managed Care - PPO | Source: Ambulatory Visit | Attending: Student | Admitting: Student

## 2018-02-17 ENCOUNTER — Encounter (HOSPITAL_COMMUNITY): Payer: Self-pay

## 2018-02-17 DIAGNOSIS — Z1231 Encounter for screening mammogram for malignant neoplasm of breast: Secondary | ICD-10-CM | POA: Insufficient documentation

## 2018-02-23 DIAGNOSIS — R6889 Other general symptoms and signs: Secondary | ICD-10-CM | POA: Diagnosis not present

## 2018-02-23 DIAGNOSIS — J101 Influenza due to other identified influenza virus with other respiratory manifestations: Secondary | ICD-10-CM | POA: Diagnosis not present

## 2018-07-27 ENCOUNTER — Ambulatory Visit (INDEPENDENT_AMBULATORY_CARE_PROVIDER_SITE_OTHER): Payer: Self-pay | Admitting: Vascular Surgery

## 2019-06-24 IMAGING — MG DIGITAL SCREENING BILATERAL MAMMOGRAM WITH TOMO AND CAD
6 of 12 series · 6 of 36 positions shown · non-contrast
Comparison: Previous exam(s).

CLINICAL DATA: Screening.

EXAM:
DIGITAL SCREENING BILATERAL MAMMOGRAM WITH TOMO AND CAD

[L CC synth-2D (1 of 2)]
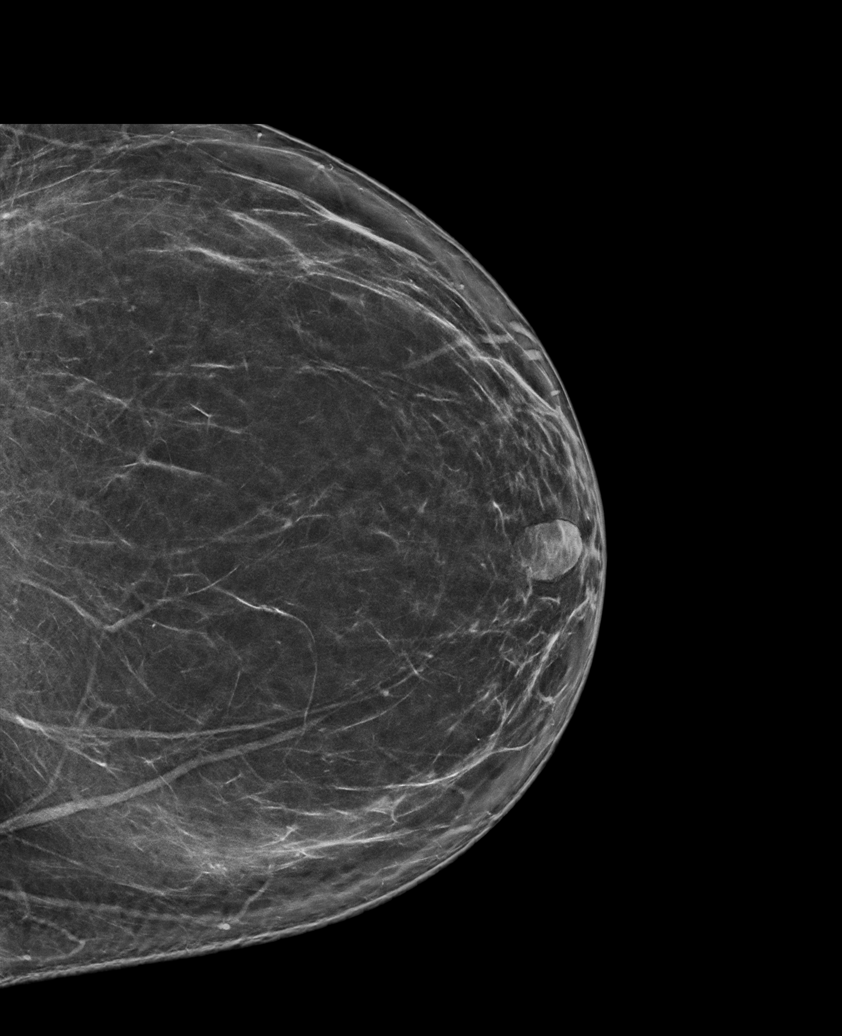

[R CC synth-2D]
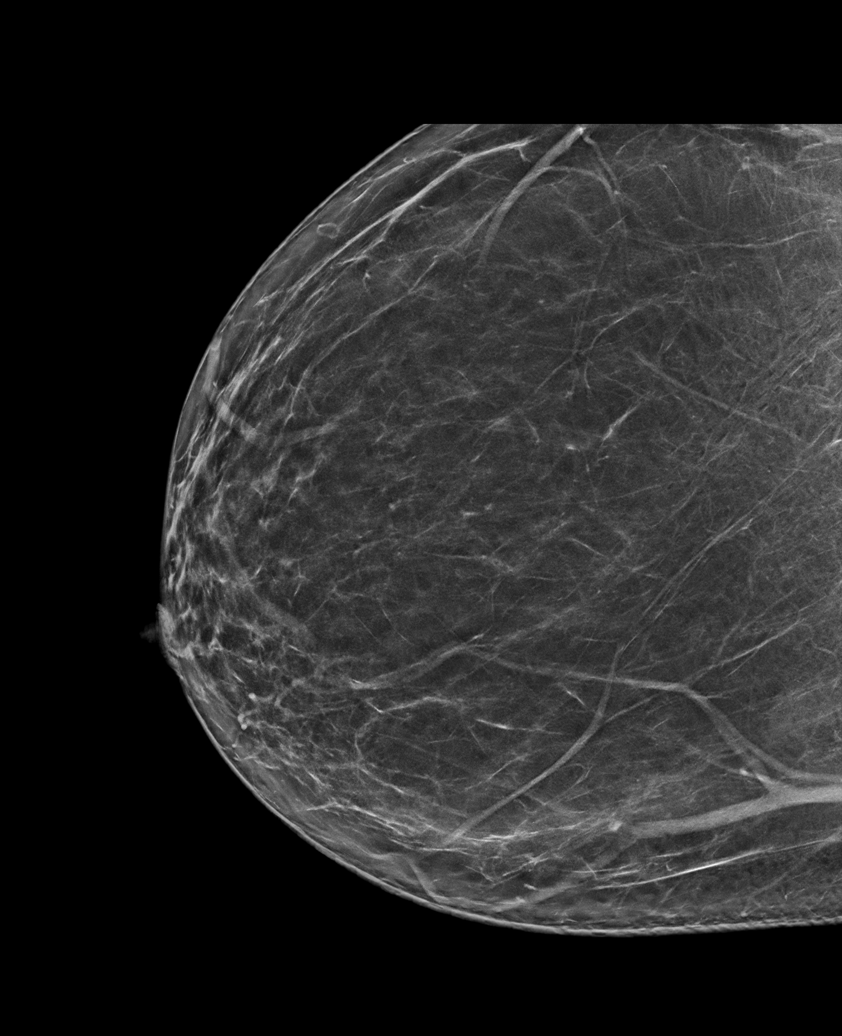

[L CC synth-2D (2 of 2)]
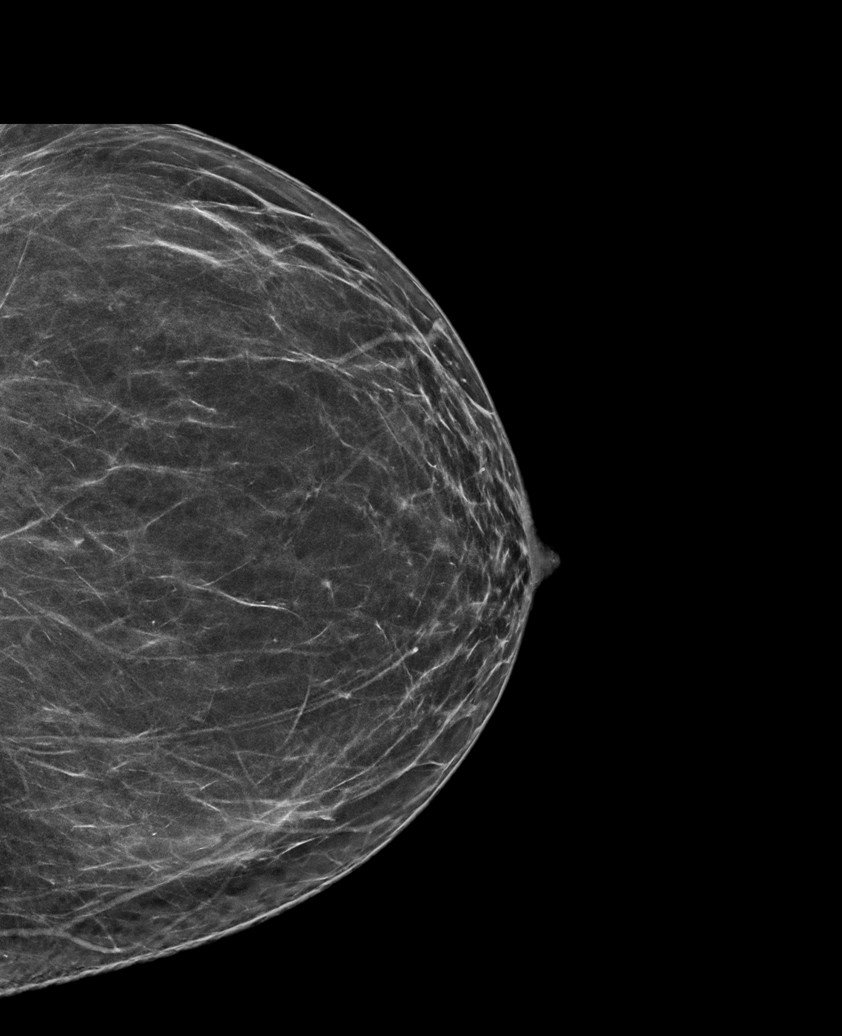

[R XCCL synth-2D]
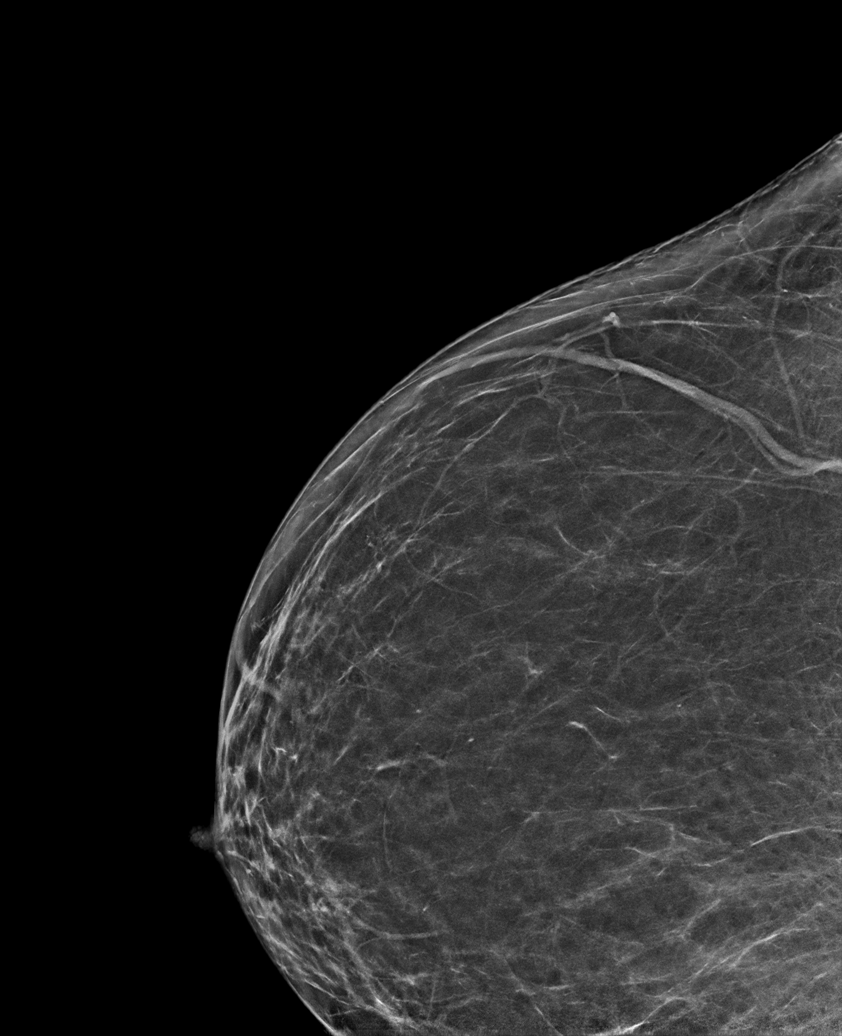

[R MLO synth-2D]
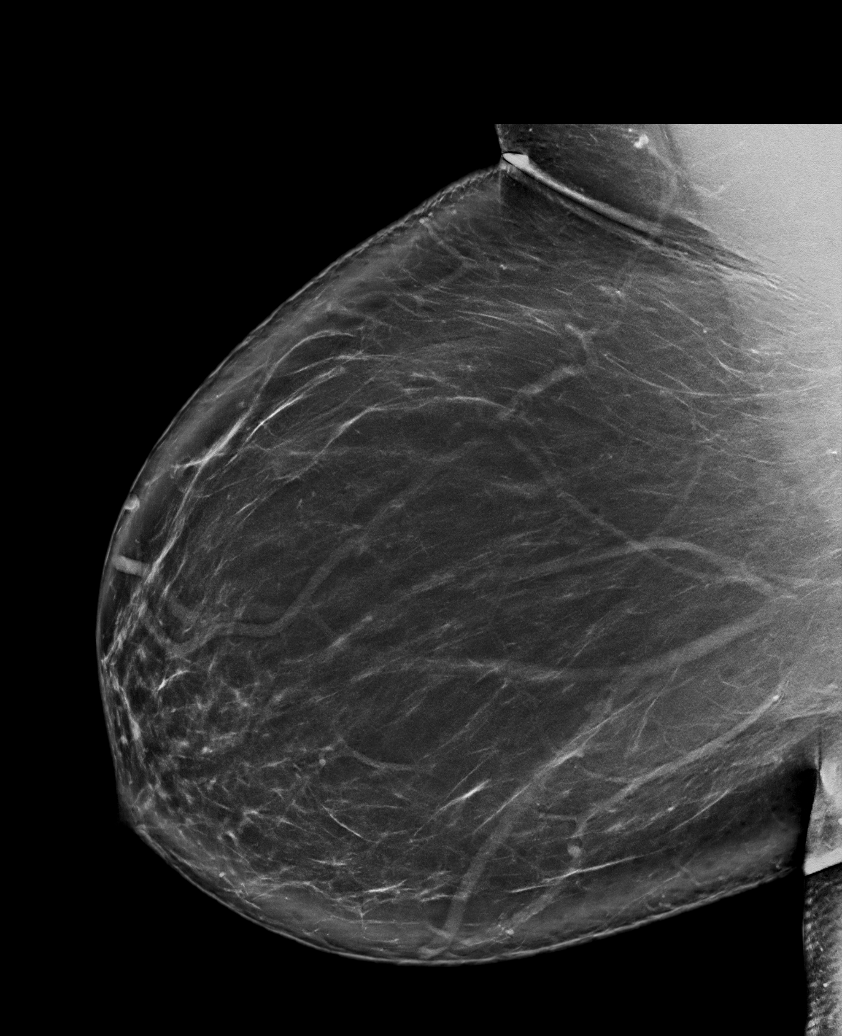

[L MLO synth-2D]
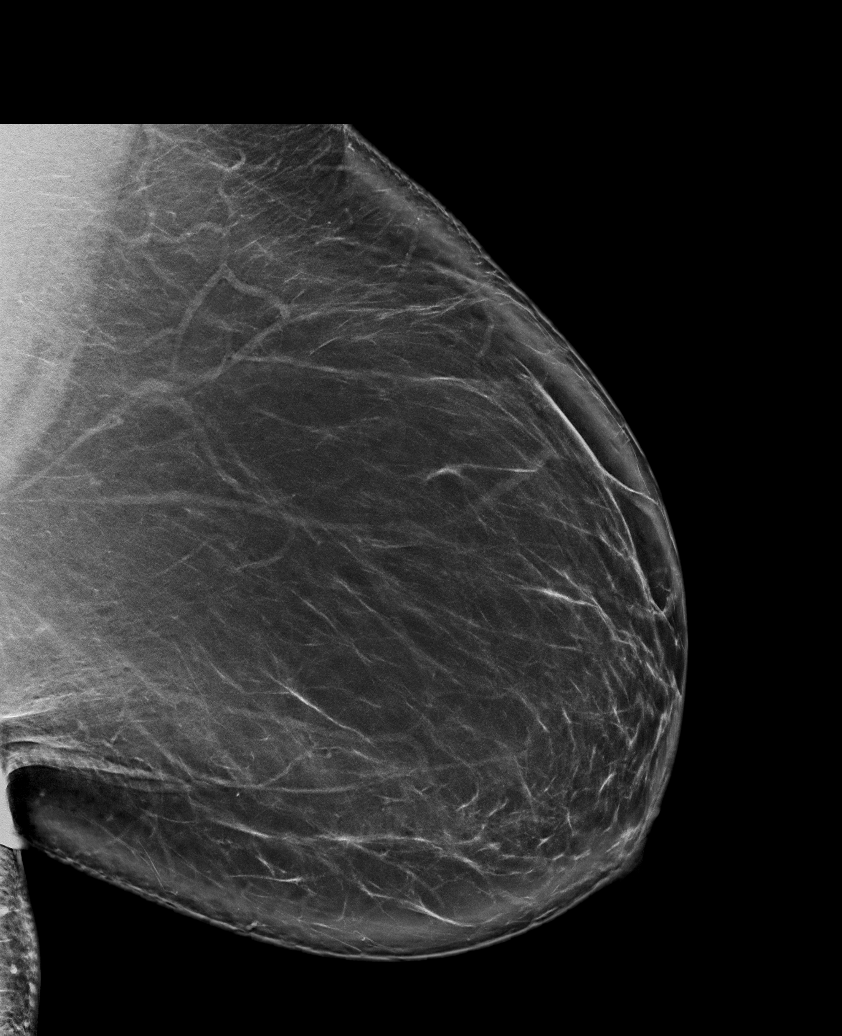

[6 of 36 positions shown; findings below may reference images not displayed]

ACR Breast Density Category b: There are scattered areas of
fibroglandular density.
FINDINGS: There are no findings suspicious for malignancy. Images were
processed with CAD.
IMPRESSION: No mammographic evidence of malignancy. A result letter of this
screening mammogram will be mailed directly to the patient.

RECOMMENDATION:
Screening mammogram in one year. (Code:[id number])

BI-RADS CATEGORY  1: Negative.

## 2021-09-20 NOTE — Progress Notes (Signed)
Formatting of this note is different from the original.  Patient here for yearly contact lens check.    Patient has been doing great in these contacts.     Patient has had no issues    Patient to continue with the current routine of wear and disinfection/BIOTRUE    HISTORY OF ALLERGY TO OPTIFREE    Patient to discard contacts as instructed every 2 weeks     Patient instructed that if any issues occur in the contacts they are to stop the contacts and call the office    ISSUED WRITTEN CLRX    Final Contact Lens Rx     Brand Base Curve Diameter Sphere Cylinder Axis Addl. Specs   Right Acuvue Oasys [place] 8.6 14.5 -8.50 -1.75 010 6pk   Left Acuvue Oasys [place] 8.6 14.5 -9.00 -1.25 170 6pk   Expiration Date: 09/21/2022        $60 today       Patient to return in 1 year for contact lens check then CE with Dr. Tamera Reason       Electronically signed by Dietrich Pates Pritt, COT at 09/20/2021 10:30 AM EDT

## 2022-03-13 ENCOUNTER — Inpatient Hospital Stay: Primary: Family

## 2022-03-13 NOTE — Other (Signed)
PAT Activity Status Questionnaire       Yes No Points for YES    Are you able to climb a flight of stairs or walk up a hill? [x]  []  1   Are you able to do heavy work around the house like lifting or moving heavy furniture? [x]  []  1   Do yardwork like raking leaves, weeding or pushing a power mower? [x]  []  1   Participate in strenuous sports like swimming, singles tennis, football, basketball or skiing? []  [x]  1   Total Score:      3       If TOTAL SCORE is <3, send chart for anesthesia review.    Instructed to bring CPAP on admission. No removable prosthetic devices or family history of malignant hyperthermia. CHG wash x 3 days instructions reviewed. PCP is aware of the surgery. No participation in clinical trial or research study. Do not bring any valuables on DOS- jewelry, wallet, cash, laptop, medications.  Does not meet criteria for special population at this time. Possible time delay day of surgery reviewed. Instructed to bring Insurance card and ID on the day of surgery.  DNR status-none.  Would like to participate in [facility] program.

## 2022-03-19 NOTE — H&P (Signed)
[facility]  [address] [place] [address]  Tel:  [phone]  Fax: [phone]    Patient: Susan Mack    Date of Birth: 08/11/1975   Birth Sex: Female   Current Gender: Female   Date: 03/19/2022 10:59 AM   Visit Type: Office Visit - GYN   This 47 year old patient presents for Irregular Menses:.    History of Present Illness  1.  Irregular Menses:   The symptoms began 1 year ago and generally lasts varies. The symptoms are reported as being moderate. The symptoms occur randomly. The location is uterine. The symptoms are described as cramping. Aggravating factors include menses. Relieving factors include time. The patient states the symptoms are acute and are unchanged. Patient presents for a pre op visit from a [facility] D&C. Patient states that her cycles are not heavy, but does have some cramping.            Gynecologic History  Patient is Premenopausal. Last menses was 12/29/2021.   Menarche:  47 y.o. (onset)  03/19/2022 10:59 AM  Date of last Pap: 01/16/2022.  Date of last mammogram: 02/14/2022.   Pregnancy # Birth order Date Delivery Type GA(wks) Labor(hrs) Weight Sex   1  [id number] NSVD [redacted]w[redacted]d   Female   2  [id number] NSVD [redacted]w[redacted]d   Female   3  [id number] NSVD [redacted]w[redacted]d   Female   Medical History  (Detailed)    Surgical History/Management  (Detailed)  Management Date Comments   D&C       Diagnostics History  Status Study Ordered Completed Interpretation  Result / Report   ordered ELECTROCARDIOGRAM, COMPLETE 02/22/2022       obtained PAP, liquid based 03/31/2020       ordered Screening Mammography Digital 06/14/2021       result received Screening Mammography Digital 03/31/2020  See module     result received Screening Mammography Digital 01/16/2022  See module     ordered TRANSVAGINAL US, NON-OB 02/13/2022       result received US SOFT TISSUE HEAD AND NECK 04/11/2020  See module       Pap Result, HPV Detail and Treatment Performed  Done Pap and HPV/Treatment Pap Results HPV Details Treatment  Results Comments   01/16/2022 Pap Performed       04/03/2020 Pap Performed       03/31/2020 Pap Performed           Problem List  Problem List reviewed.       Medications (active prior to today)  Medication Name Sig Description Start Date Stop Date Refilled Rx Elsewhere   spironolactone 50 mg tablet TAKE 3 TABLETS BY MOUTH EVERY NIGHT 12/20/2021  12/20/2021 N   Tazorac 0.1 % topical cream apply by topical route  every day to the affected area(s) 12/20/2021  12/20/2021 N   Ozempic (2 MG/DOSE) Subcutaneous Solution Pen-injector 8 MG/3ML INJECT 2MG  UNDER SKIN ONCE WEEKLY ON THE SAME DAY WEEKLY 02/20/2022  02/20/2022 N   ferrous sulfate 325 mg (65 mg iron) tablet take 1 tablet by ORAL route  every day 03/12/2022   N   Colace 100 mg capsule take 1 capsule by oral route  every day at bedtime as needed as needed 03/12/2022   N       Medication Reconciliation  Medications reconciled today.      Allergies  Ingredient Reaction (Severity) Comment   NO KNOWN ALLERGIES  Family History    (Detailed)  Relationship Family Member Name Deceased Age at Death Condition Onset Age Cause of Death   Father    hypertension  N   Father    malignant neoplasm of thyroid  N   Father    diabetes mellitus in first degree relative  N   Mother    malignant neoplasm of pancreas  N     Social History  (Detailed)  Preferred language is English.      Marital Status/Family/Social Support  Marital status: Married     Tobacco use status: Current non-smoker.    Smoking status: Never smoker.      Review of Systems  System Neg/Pos Details   Constitutional Negative Chills, Fatigue, Fever and Night sweats.   GI Negative Abdominal pain, Blood in stool, Change in stool pattern, Constipation, Decreased appetite, Diarrhea, Heartburn, Nausea and Vomiting.   GU Positive Abnormal vaginal bleeding.   GU Negative Dysuria, Hematuria, Nocturia, Polyuria (Genitourinary), Urgency, Urinary frequency, Urinary incontinence, Urinary retention and Vaginal dryness.    Neuro Negative Difficulty initiating sleep.   Psych Negative Anxiety, Depression, Insomnia and Sleep disturbances.   Hema/Lymph Negative Abnormal bleeding.   Reproductive Positive Breast self exam, Irregular menses, Menarche age (Menarche age was 52), Menses (Last menses was 12/29/2021.  Menses is Irregular.  The flow is normal), The patient is pre-menopausal.   Reproductive Negative Breast discharge, Breast lumps, Breast pain, Decreased libido, Dyspareunia, History of abnormal PAP smear, History of infertility, Hot flashes, Sexual dysfunction, Vaginal discharge and Vaginal itching.       Physical Exam  Exam Findings Details   Constitutional Normal No acute distress. Well nourished. Well developed.   Respiratory Normal Effort - Normal.   Psychiatric Normal Orientation - Oriented to time, place, person & situation. Appropriate mood and affect.     Assessment/Plan  # Detail Type Description    1. Assessment Abnormal uterine bleeding (N93.9).    Patient Plan Regular monthly menses w/ spotting in between x 6 mo.  I d/w patient possible etiologies, evaluation and management.  TVUS reviewed w/ patient: see below.  She opts for a [facility] D&C for further endometrial evaluation and sampling. 02/14/22 TT --> No new c/o.  Q/A.  Perc/Ibupro; ERx.  Proceed w/ [facility] D&C. 03/19/22 TT  - TVUS (02/14/22): AV uterus (11.7 x 8.3 x 5.9 cm); ET (10.74mm); ovaries wnl b/l.  At least 8 fibroids, the largest noted: transmural fundal L (4.9cm) invading the endometrium, intramural posterior L (2.6cm), submucosal R (1.9cm).         2. 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    Patient Plan See above.         3. Assessment Acne (L70.9).    Patient Plan Spironolactone x 3 years.         4. 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    Patient Plan Cont close f/u w/ PCP/Endo.       [facility] D&C. 03/19/22 TT    R/B/A d/w patient.  She understands that she is at increased risk for, but not limited to, infection, bleeding, blood transfusion, and/or damage  to neighboring organs.  All questions answered at this time.              Medications (Added, Continued or Stopped today)  Start Date Medication Directions PRN Status PRN Reason Instruction Stop Date   03/12/2022 Colace 100 mg capsule take 1 capsule by oral route  every day at bedtime as needed as needed Y  03/12/2022 ferrous sulfate 325 mg (65 mg iron) tablet take 1 tablet by ORAL route  every day N      02/20/2022 Ozempic (2 MG/DOSE) Subcutaneous Solution Pen-injector 8 MG/3ML INJECT 2MG  UNDER SKIN ONCE WEEKLY ON THE SAME DAY WEEKLY N      12/20/2021 spironolactone 50 mg tablet TAKE 3 TABLETS BY MOUTH EVERY NIGHT N      12/20/2021 Tazorac 0.1 % topical cream apply by topical route  every day to the affected area(s) N        Service CPT Mod Dx 1 Dx 2 Dx 3 Dx 4   OFFICE/OUTPATIENT VISIT, EST 99214  N93.9 D25.9 L70.9 E04.9   MED LIST DOCD IN RCRD 1159F  Z00.00        ndc cpt4    1159F    99214     Provider  Georgia Lopes X 03/19/2022 11:19 AM   Document generated by: Georgia Lopes 03/19/2022 11:19 AM      ----------------------------------------------------------------------------------------------------------------------------------------------------------------------

## 2022-04-03 ENCOUNTER — Inpatient Hospital Stay: Payer: PRIVATE HEALTH INSURANCE | Attending: Obstetrics

## 2022-04-03 LAB — POCT GLUCOSE
POC Glucose: 108 mg/dL (ref 70–110)
POC Glucose: 90 mg/dL (ref 70–110)

## 2022-04-03 LAB — POC PREGNANCY UR-QUAL: Preg Test, Ur: NEGATIVE

## 2022-04-03 MED ORDER — BUPIVACAINE HCL (PF) 0.25 % IJ SOLN
0.25 | INTRAMUSCULAR | Status: DC | PRN
Start: 2022-04-03 — End: 2022-04-03
  Administered 2022-04-03: 16:00:00 20 via INTRADERMAL

## 2022-04-03 MED ORDER — DIPHENHYDRAMINE HCL 50 MG/ML IJ SOLN
50 | Freq: Once | INTRAMUSCULAR | Status: DC | PRN
Start: 2022-04-03 — End: 2022-04-03

## 2022-04-03 MED ORDER — NORMAL SALINE FLUSH 0.9 % IV SOLN
0.9 | INTRAVENOUS | Status: DC | PRN
Start: 2022-04-03 — End: 2022-04-03

## 2022-04-03 MED ORDER — LACTATED RINGERS IV SOLN
INTRAVENOUS | Status: DC
Start: 2022-04-03 — End: 2022-04-03
  Administered 2022-04-03: 14:00:00 via INTRAVENOUS

## 2022-04-03 MED ORDER — SODIUM CHLORIDE 0.9 % IV SOLN
0.9 | INTRAVENOUS | Status: DC | PRN
Start: 2022-04-03 — End: 2022-04-03

## 2022-04-03 MED ORDER — ONDANSETRON HCL 4 MG/2ML IJ SOLN
4 MG/2ML | INTRAMUSCULAR | Status: DC | PRN
  Administered 2022-04-03: 15:00:00 4 via INTRAVENOUS

## 2022-04-03 MED ORDER — ONDANSETRON HCL 4 MG/2ML IJ SOLN
4 | Freq: Once | INTRAMUSCULAR | Status: DC | PRN
Start: 2022-04-03 — End: 2022-04-03

## 2022-04-03 MED ORDER — KETOROLAC TROMETHAMINE 15 MG/ML IJ SOLN
15 MG/ML | INTRAMUSCULAR | Status: DC | PRN
  Administered 2022-04-03: 16:00:00 30 via INTRAVENOUS

## 2022-04-03 MED ORDER — MIDAZOLAM HCL 2 MG/2ML IJ SOLN
2 MG/2ML | INTRAMUSCULAR | Status: DC | PRN
  Administered 2022-04-03: 15:00:00 2 via INTRAVENOUS

## 2022-04-03 MED ORDER — BUPIVACAINE HCL (PF) 0.25 % IJ SOLN
0.25 | INTRAMUSCULAR | Status: AC
Start: 2022-04-03 — End: ?

## 2022-04-03 MED ORDER — PROPOFOL 200 MG/20ML IV EMUL
200 MG/20ML | INTRAVENOUS | Status: DC | PRN
  Administered 2022-04-03: 15:00:00 200 via INTRAVENOUS

## 2022-04-03 MED ORDER — LACTATED RINGERS IV SOLN
INTRAVENOUS | Status: DC
Start: 2022-04-03 — End: 2022-04-03

## 2022-04-03 MED ORDER — LIDOCAINE HCL (PF) 2 % IJ SOLN
2 % | INTRAMUSCULAR | Status: DC | PRN
  Administered 2022-04-03: 15:00:00 80 via INTRAVENOUS

## 2022-04-03 MED ORDER — GLYCOPYRROLATE 0.2 MG/ML IJ SOLN
0.2 MG/ML | INTRAMUSCULAR | Status: DC | PRN
  Administered 2022-04-03: 15:00:00 .2 via INTRAVENOUS

## 2022-04-03 MED ORDER — FENTANYL CITRATE PF 50 MCG/ML IJ SOSY
50 MCG/ML | INTRAMUSCULAR | Status: DC | PRN
  Administered 2022-04-03 (×2): 50 via INTRAVENOUS

## 2022-04-03 MED ORDER — LABETALOL HCL 5 MG/ML IV SOLN
5 | INTRAVENOUS | Status: DC | PRN
Start: 2022-04-03 — End: 2022-04-03

## 2022-04-03 MED ORDER — NORMAL SALINE FLUSH 0.9 % IV SOLN
0.9 | Freq: Two times a day (BID) | INTRAVENOUS | Status: DC
Start: 2022-04-03 — End: 2022-04-03

## 2022-04-03 MED ORDER — HYDROMORPHONE HCL PF 1 MG/ML IJ SOLN
1 | INTRAMUSCULAR | Status: DC | PRN
Start: 2022-04-03 — End: 2022-04-03

## 2022-04-03 MED ORDER — FENTANYL CITRATE (PF) 100 MCG/2ML IJ SOLN
100 | INTRAMUSCULAR | Status: DC | PRN
Start: 2022-04-03 — End: 2022-04-03

## 2022-04-03 MED ORDER — DROPERIDOL 2.5 MG/ML IJ SOLN
2.5 | Freq: Once | INTRAMUSCULAR | Status: DC | PRN
Start: 2022-04-03 — End: 2022-04-03

## 2022-04-03 MED ORDER — OXYCODONE HCL 5 MG PO TABS
5 | Freq: Once | ORAL | Status: DC | PRN
Start: 2022-04-03 — End: 2022-04-03

## 2022-04-03 MED ORDER — DEXAMETHASONE 4 MG/ML IJ SOLN (MIXTURES ONLY)
4 MG/ML | INTRAMUSCULAR | Status: DC | PRN
  Administered 2022-04-03: 15:00:00 4 via INTRAVENOUS

## 2022-04-03 MED FILL — LACTATED RINGERS IV SOLN: INTRAVENOUS | Qty: 1000

## 2022-04-03 MED FILL — BD POSIFLUSH 0.9 % IV SOLN: 0.9 % | INTRAVENOUS | Qty: 40

## 2022-04-03 MED FILL — BUPIVACAINE HCL (PF) 0.25 % IJ SOLN: 0.25 % | INTRAMUSCULAR | Qty: 30

## 2022-04-03 NOTE — Other (Signed)
TRANSFER - IN REPORT:    Verbal report received from OR on Susan Mack  being received from OR for routine post-op      Report consisted of patient's Situation, Background, Assessment and   Recommendations(SBAR).     Information from the following report(s) Nurse Handoff Report, Adult Overview, Surgery Report, Intake/Output, MAR, and Cardiac Rhythm NSR  was reviewed with the receiving nurse.    Opportunity for questions and clarification was provided.      Assessment completed upon patient's arrival to unit and care assumed.

## 2022-04-03 NOTE — Anesthesia Pre-Procedure Evaluation (Signed)
Department of Anesthesiology  Preprocedure Note       Name:  Susan Mack   Age:  47 y.o.  DOB:  [birth year]                                          MRN:  [id number]         Date:  04/03/2022      Surgeon: Juliann Mule):  Letitia Neri, MD    Procedure: Procedure(s):  HYSTEROSCOPY DILATATION AND CURETTAGE    Medications prior to admission:   Prior to Admission medications    Medication Sig Start Date End Date Taking? Authorizing Provider   semaglutide, 2 MG/DOSE, (OZEMPIC, 2 MG/DOSE,) 8 MG/3ML SOPN sc injection Inject 0.75 mLs into the skin every 7 days Indications: Type 2 DM Takes on Thursday   Yes [provider]   spironolactone (ALDACTONE) 50 MG tablet Take 3 tablets by mouth daily Indications: acne   Yes [provider]       Current medications:    Current Facility-Administered Medications   Medication Dose Route Frequency Provider Last Rate Last Admin    lactated ringers IV soln infusion   IntraVENous Continuous Letitia Neri, MD 125 mL/hr at 04/03/22 0921 New Bag at 04/03/22 [id number]       Allergies:    Allergies   Allergen Reactions    Pcn [Penicillins] Rash     During childhood       Problem List:    Patient Active Problem List   Diagnosis Code    Abnormal uterine bleeding (AUB) N93.9       Past Medical History:        Diagnosis Date    Acne     adult- hormones    Diabetes mellitus ([place]) 2023    OSA on CPAP 2018    Wears contact lenses     also has glasses       Past Surgical History:        Procedure Laterality Date    DILATION AND CURETTAGE OF UTERUS  2011    WISDOM TOOTH EXTRACTION  2016       Social History:    Social History     Tobacco Use    Smoking status: Never    Smokeless tobacco: Never   Substance Use Topics    Alcohol use: Not Currently                                Counseling given: Not Answered      Vital Signs (Current):   Vitals:    03/13/22 1624 04/03/22 0844   BP:  (!) 144/79   Pulse:  81   Resp:  16   Temp:  97.7 F (36.5 C)   TempSrc:  Temporal   SpO2:  100%   Weight:  (!) 152 kg (335 lb) (!) 152 kg (335 lb 3.2 oz)   Height: 1.778 m (5' 10"$ ) 1.778 m (5' 10"$ )                                              BP Readings from Last 3 Encounters:   04/03/22 (!) 144/79  NPO Status: Time of last liquid consumption: 2300                        Time of last solid consumption: 1930                        Date of last liquid consumption: 04/02/22                        Date of last solid food consumption: 04/02/22    BMI:   Wt Readings from Last 3 Encounters:   04/03/22 (!) 152 kg (335 lb 3.2 oz)     Body mass index is 48.1 kg/m.    CBC: No results found for: "WBC", "RBC", "HGB", "HCT", "MCV", "RDW", "PLT"    CMP: No results found for: "NA", "K", "CL", "CO2", "BUN", "CREATININE", "GFRAA", "AGRATIO", "LABGLOM", "GLUCOSE", "GLU", "PROT", "CALCIUM", "BILITOT", "ALKPHOS", "AST", "ALT"    POC Tests:   Recent Labs     04/03/22  0919   POCGLU 108       Coags: No results found for: "PROTIME", "INR", "APTT"    HCG (If Applicable):   Lab Results   Component Value Date    PREGTESTUR Negative 04/03/2022        ABGs: No results found for: "PHART", "PO2ART", "PCO2ART", "HCO3ART", "BEART", "O2SATART"     Type & Screen (If Applicable):  No results found for: "LABABO", "LABRH"    Drug/Infectious Status (If Applicable):  No results found for: "HIV", "HEPCAB"    COVID-19 Screening (If Applicable): No results found for: "COVID19"        Anesthesia Evaluation  Patient summary reviewed and Nursing notes reviewed  Airway: Mallampati: II  TM distance: >3 FB   Neck ROM: full  Mouth opening: > = 3 FB   Dental: normal exam         Pulmonary:   (+)     sleep apnea: on CPAP,                                  Cardiovascular:Negative CV ROS  Exercise tolerance: good (>4 METS)                    Neuro/Psych:   Negative Neuro/Psych ROS              GI/Hepatic/Renal:   (+) morbid obesity          Endo/Other:    (+) DiabetesType II DM, no interval change.                 Abdominal:             Vascular: negative vascular  ROS.         Other Findings:             Anesthesia Plan      general     ASA 3       Induction: intravenous.      Anesthetic plan and risks discussed with patient and spouse.      Plan discussed with CRNA.    Attending anesthesiologist reviewed and agrees with Preprocedure content          No ozempic in over 1 week - ok for LMA      Sela Hilding, MD   04/03/2022

## 2022-04-03 NOTE — Other (Signed)
Reviewed PTA medication list with patient/caregiver and patient/caregiver denies any additional medications.     Patient admits to having a responsible adult care for them at home for at least 24 hours after surgery.    Patient encouraged to use gown warming system and informed that using said warming gown to regulate body temperature prior to a procedure has been shown to help reduce the risks of blood clots and infection.    Patient's pharmacy of choice verified and documented in PTA medication section.    Dual skin assessment & fall risk band verification completed with Faith RN.     Urine pregnancy results negative and verified with [place].

## 2022-04-03 NOTE — Other (Signed)
Discharge instructions given to patient and caregiver. Written instructions given to take home. Verbal understanding given by patient and caregiver. ID band removed before leaving.

## 2022-04-03 NOTE — Anesthesia Post-Procedure Evaluation (Signed)
Post-Anesthesia Evaluation and Assessment    Cardiovascular Function/Vital Signs/Pain Score:  Vitals  BP: 125/68  Temp: 97.6 F (36.4 C)  Temp Source: Temporal  Pulse: 74  Respirations: 14  SpO2: 100 %  Height: 177.8 cm (5' 10"$ )  Weight - Scale: (!) 152 kg (335 lb 3.2 oz)  Pain Level: 0    Patient is status post Procedure(s):  HYSTEROSCOPY DILATATION AND CURETTAGE.    Nausea/Vomiting: Controlled.    Postoperative hydration reviewed and adequate.    Pain:  Managed    Mental Status and Level of Consciousness: Baseline and appropriate for discharge.    Adequate oxygenation and airway patent.    Complications related to anesthesia: None    Post-anesthesia assessment completed. No concerns.    Patient has met all discharge requirements.    Signed By: Sela Hilding, MD    April 03, 2022

## 2022-04-03 NOTE — Other (Signed)
TRANSFER - OUT REPORT:    Verbal report given to Shawn RN  on Susan Mack  being transferred to phase 2  for routine post-op       Report consisted of patient's Situation, Background, Assessment and   Recommendations(SBAR).     Information from the following report(s) Nurse Handoff Report, Adult Overview, Surgery Report, Intake/Output, MAR, and Cardiac Rhythm NSR  was reviewed with the receiving nurse.           Lines:   Peripheral IV 04/03/22 Right;Posterior Arm (Active)   Site Assessment Clean, dry & intact 04/03/22 1056   Line Status Infusing 04/03/22 [place] Connections checked and tightened 04/03/22 1056   Phlebitis Assessment No symptoms 04/03/22 1056   Infiltration Assessment 0 04/03/22 0921   Alcohol Cap Used No 04/03/22 1043   Dressing Status Clean, dry & intact 04/03/22 1056   Dressing Type Transparent 04/03/22 1056   Dressing Intervention New 04/03/22 [id number]        Opportunity for questions and clarification was provided.      Patient transported with:  [employer]

## 2022-04-03 NOTE — Other (Signed)
Family updated.

## 2022-04-03 NOTE — Other (Signed)
TRANSFER - IN REPORT:    Verbal report received from [place], [place] on Golden  being received from PACU for routine progression of patient care      Report consisted of patient's Situation, Background, Assessment and   Recommendations(SBAR).     Information from the following report(s) Intake/Output and MAR was reviewed with the receiving nurse.    Opportunity for questions and clarification was provided.      Assessment completed upon patient's arrival to unit and care assumed.

## 2022-04-03 NOTE — Interval H&P Note (Signed)
Update History & Physical    The patient's History and Physical of March 19, 2022 was reviewed with the patient and I examined the patient. There was no change. The surgical site was confirmed by the patient and me.     Plan: The risks, benefits, expected outcome, and alternative to the recommended procedure have been discussed with the patient. Patient understands and wants to proceed with the procedure.     Electronically signed by Roselind Rily, MD on 04/03/2022 at 9:36 AM

## 2022-04-03 NOTE — Other (Signed)
Patient assisted to the restroom and back to stretcher without incident.  Call bell within reach and no further needs at this time.

## 2022-04-03 NOTE — Discharge Instructions (Signed)
Follow ALL directions given by Dr. Edd Arbour  Call if unable to void, for excessive vaginal bleeding-soaking through 1 maxi pad in 1 hour 2 hours straight, for fever, nausea and/or vomiting  Ambulates in the house several times during the day  Pelvic Rest, nothing in th vagina-sex, tampons, douche until advised by Dr. Edd Arbour  Take medications as directed  Follow up at scheduled appointment and call Dr. Edd Arbour with any questions or concerns      DISCHARGE SUMMARY from Nurse    PATIENT INSTRUCTIONS:    After general anesthesia or intravenous sedation, for 24 hours or while taking prescription Narcotics:  Limit your activities  Do not drive and operate hazardous machinery  Do not make important personal or business decisions  Do  not drink alcoholic beverages  If you have not urinated within 8 hours after discharge, please contact your surgeon on call.    Report the following to your surgeon:  Excessive pain, swelling, redness or odor of or around the surgical area  Temperature over 100.5  Nausea and vomiting lasting longer than 4 hours or if unable to take medications  Any signs of decreased circulation or nerve impairment to extremity: change in color, persistent  numbness, tingling, coldness or increase pain  Any questions    What to do at Home:  Recommended activity: ambulate in house and no lifting, Driving, or Strenuous exercise until advised    If you experience any of the following symptoms above, please follow up with Dr. Edd Arbour.    *  Please give a list of your current medications to your Primary Care Provider.    *  Please update this list whenever your medications are discontinued, doses are      changed, or new medications (including over-the-counter products) are added.    *  Please carry medication information at all times in case of emergency situations.    These are general instructions for a healthy lifestyle:    No smoking/ No tobacco products/ Avoid exposure to second hand smoke  Surgeon General's Warning:   Quitting smoking now greatly reduces serious risk to your health.    Obesity, smoking, and sedentary lifestyle greatly increases your risk for illness    A healthy diet, regular physical exercise & weight monitoring are important for maintaining a healthy lifestyle    You may be retaining fluid if you have a history of heart failure or if you experience any of the following symptoms:  Weight gain of 3 pounds or more overnight or 5 pounds in a week, increased swelling in our hands or feet or shortness of breath while lying flat in bed.  Please call your doctor as soon as you notice any of these symptoms; do not wait until your next office visit.     Patient armband removed and shredded     The discharge information has been reviewed with the patient and caregiver.  The patient and caregiver verbalized understanding.  Discharge medications reviewed with the patient and caregiver and appropriate educational materials and side effects teaching were provided.           Nausea and Vomiting After Surgery: Care Instructions  Your Care Instructions     After you've had surgery, you may feel sick to your stomach (nauseated) or you may vomit. Sometimes anesthesia can make you feel sick. It's a common side effect and often doesn't last long. Pain also can make you feel sick or vomit. After the anesthesia wears off, you may  feel pain from the incision (cut). That pain could then upset your stomach. Taking pain medicine can also make you feel sick to your stomach.  Whatever the cause, you may get medicine that can help. There are also some things you can do at home to prevent nausea and feel better.  The doctor has checked you carefully, but problems can develop later. If you notice any problems or new symptoms, get medical treatment right away.  Follow-up care is a key part of your treatment and safety. Be sure to make and go to all appointments, and call your doctor if you are having problems. It's also a good idea to know your  test results and keep a list of the medicines you take.  How can you care for yourself at home?  Take your pain medicine as soon as you have pain. It works better if you take it before the pain gets bad.  Call your doctor if you have any problems with your medicine.  To prevent dehydration, drink plenty of fluids. Choose water and other clear liquids until you feel better. If you have kidney, heart, or liver disease and have to limit fluids, talk with your doctor before you increase the amount of fluids you drink.  When you are able to eat, try clear soups, mild foods, and liquids until all symptoms are gone for 12 to 48 hours. Other good choices include dry toast, crackers, cooked cereal, and gelatin dessert, such as Jell-O.  Do not smoke. Smoking and being around smoke can make nausea worse.   When should you call for help?    Call your doctor now or seek immediate medical care if:    You have new or worse nausea or vomiting.     You are too sick to your stomach to drink any fluids.     You cannot keep down fluids.     You have symptoms of dehydration, such as:  Dry eyes and a dry mouth.  Passing only a little urine.  Feeling thirstier than usual.     You are dizzy or lightheaded, or you feel like you may faint.           Infection After Surgery: Care Instructions  Overview  After surgery, an infection is always possible. It doesn't mean that the surgery didn't go well.  How can you care for yourself at home?  Make sure your surgeon knows about the infection, especially if you saw another doctor about your symptoms.  If your doctor prescribed antibiotics, take them as directed. Do not stop taking them just because you feel better. You need to take the full course of antibiotics.  Keep the skin clean and dry.  You may have a bandage over the cut (incision). A bandage helps the incision heal and protects it. Your doctor will tell you how to take care of this. Keep it clean and dry. You may have drainage from the  wound.     Call your doctor now or seek immediate medical care if:    You have signs of an infection such as:  Increased pain, swelling, warmth, or redness in the area.  Red streaks leading from the area.  Pus draining from the wound.  A new or higher fever.       Bleeding After Surgery: Care Instructions  Overview  After surgery, it is common to have some minor bruising or bleeding from the cut (incision) made by your doctor. But problems may occur  that cause you to bleed too much in the surgery area.  An injury to a blood vessel can cause bleeding after surgery. Other causes include medicines such as aspirin or anticoagulants (blood thinners).  To help stop the bleeding, your doctor may have put pressure on the incision or sewn up or cauterized (sealed) the incision. Or you may have had surgery to stop bleeding inside the surgery area. Your doctor also may have given you medicines that help stop the bleeding.  How can you care for yourself at home?  If you have strips of tape on the incision, leave the tape on until it falls off. Or follow your doctor's instructions for removing the tape. Keep the area dry at all times.  You will have a dressing over the incision. A dressing helps the incision heal and protects it. Your doctor will tell you how to take care of this.  If you do not have tape on the incision, wash the area daily with warm, soapy water, and pat it dry. Don't use hydrogen peroxide or alcohol, which can slow healing.    When should you call for help?    Call your doctor now or seek immediate medical care if:    You are dizzy or lightheaded, or you feel like you may faint.     You have bleeding that starts again or gets worse, such as soaking one or more bandages over 2 to 4 hours, even after holding pressure on the area.         __________________________________________________________________________________________________________________________________

## 2022-04-03 NOTE — Op Note (Signed)
[place]  [address]                            OPERATIVE REPORT      PATIENT NAME: Susan Mack, Susan Mack           DOB: 1975-08-01  MED REC NO: [id number]                       ROOM:   ACCOUNT NO: [id number]                       ADMIT DATE: 04/03/2022  PROVIDER: Letitia Neri, MD    DATE OF SERVICE:  04/03/2022    PREOPERATIVE DIAGNOSES:       1. Irregular menses.     2. Enlarged myomatous uterus.     3. Morbid obesity (BMI 48).    POSTOPERATIVE DIAGNOSES:       1. Irregular menses.     2. Enlarged myomatous uterus.     3. Morbid obesity (BMI 48).    PROCEDURES PERFORMED:  Hysteroscopy, D and C.    SURGEON:  Letitia Neri, MD    ASSISTANT:  Colletta Manzanita and Sherlynn Stalls.    ANESTHESIA:  General, paracervical block.    ESTIMATED BLOOD LOSS:  Minimal.    SPECIMENS REMOVED:  Endometrial curettings.    INTRAOPERATIVE FINDINGS:  A very anteverted anteflexed uterus sounded to 13 cm with normal-appearing ostia bilaterally and overall unremarkable endometrial cavity.     COMPLICATIONS:  None.    IMPLANTS:  None.    INDICATIONS:  A 47 year old with complaints of abnormal uterine bleeding with regular monthly menses with spotting in between for the last 6 months.  She had a transvaginal ultrasound performed on 02/14/2022, demonstrating anteverted uterus, 7.7 x 8.2 x 5.9 cm.  Endometrial thickness 10.7 mm with normal bilaterally with 8 fibroids noted.  The largest fibroids were transmural fundal left 4.9 cm invading the endometrium, intramural posterior left 2.6 cm, submucosal right 1.9 cm.  Findings reviewed with the patient.  Risks, benefits, alternatives discussed and she opted for surgical management with hysteroscopy, D and C for further endometrial evaluation and sampling.  All questions answered prior to surgical start time.    DESCRIPTION OF PROCEDURE:  The patient was taken to the OR where anesthesia was obtained without difficulty.  She was prepped and  draped in usual sterile fashion in dorsal lithotomy position with legs in stirrups.  A bivalve speculum was placed into the vagina and retracted anterior fornix towards the cervix.  The anterior lip of the cervix was grasped with a single-tooth tenaculum.  A paracervical block was performed using 20 mL of 0.25% Marcaine plain.  The cervical os was gradually dilated to allow introduction of the hysteroscope.  This was advanced into the uterus under direct visualization with the water running.  The above findings were noted.  The hysteroscope was removed and a gentle curettage was performed until a gritty texture was noted throughout.  Specimens were handed off.  The instruments were removed.  Excellent hemostasis is ensured at the end of case.    The patient tolerated the procedure well.  All sponge, lap, needle, instrument counts were correct x2.  She was taken to recovery area in stable condition.    ANESTHESIOLOGIST:  Dr. Sela Hilding.  IV FLUIDS:  700 mL of lactated Ringer's.        Letitia Neri, MD      TXT/AQS  D:  04/03/2022 10:43:20  T:  04/03/2022 13:36:33  JOB #:  [id number]
